# Patient Record
Sex: Female | Born: 1970 | Race: White | Hispanic: No | Marital: Married | Smoking: Never smoker
Health system: Southern US, Community
[De-identification: ages and names within clinical notes are randomized; demographics above are authoritative.]

## PROBLEM LIST (undated history)

## (undated) DIAGNOSIS — Z780 Asymptomatic menopausal state: Secondary | ICD-10-CM

## (undated) DIAGNOSIS — D219 Benign neoplasm of connective and other soft tissue, unspecified: Secondary | ICD-10-CM

## (undated) DIAGNOSIS — N840 Polyp of corpus uteri: Secondary | ICD-10-CM

## (undated) DIAGNOSIS — E785 Hyperlipidemia, unspecified: Secondary | ICD-10-CM

## (undated) DIAGNOSIS — N939 Abnormal uterine and vaginal bleeding, unspecified: Secondary | ICD-10-CM

## (undated) DIAGNOSIS — N83209 Unspecified ovarian cyst, unspecified side: Secondary | ICD-10-CM

## (undated) HISTORY — DX: Hyperlipidemia, unspecified: E78.5

## (undated) HISTORY — DX: Abnormal uterine and vaginal bleeding, unspecified: N93.9

## (undated) HISTORY — PX: DILATION AND CURETTAGE OF UTERUS: SHX78

## (undated) HISTORY — DX: Benign neoplasm of connective and other soft tissue, unspecified: D21.9

## (undated) HISTORY — DX: Asymptomatic menopausal state: Z78.0

## (undated) HISTORY — DX: Polyp of corpus uteri: N84.0

## (undated) HISTORY — DX: Unspecified ovarian cyst, unspecified side: N83.209

## (undated) HISTORY — PX: OTHER SURGICAL HISTORY: SHX169

---

## 2004-02-14 ENCOUNTER — Other Ambulatory Visit: Admission: RE | Admit: 2004-02-14 | Discharge: 2004-02-14 | Payer: Self-pay | Admitting: Obstetrics and Gynecology

## 2004-03-08 ENCOUNTER — Encounter: Admission: RE | Admit: 2004-03-08 | Discharge: 2004-03-08 | Payer: Self-pay | Admitting: Obstetrics and Gynecology

## 2005-03-14 ENCOUNTER — Other Ambulatory Visit: Admission: RE | Admit: 2005-03-14 | Discharge: 2005-03-14 | Payer: Self-pay | Admitting: Obstetrics and Gynecology

## 2006-03-17 ENCOUNTER — Other Ambulatory Visit: Admission: RE | Admit: 2006-03-17 | Discharge: 2006-03-17 | Payer: Self-pay | Admitting: Obstetrics and Gynecology

## 2007-04-03 ENCOUNTER — Other Ambulatory Visit: Admission: RE | Admit: 2007-04-03 | Discharge: 2007-04-03 | Payer: Self-pay | Admitting: Obstetrics & Gynecology

## 2008-07-01 ENCOUNTER — Other Ambulatory Visit: Admission: RE | Admit: 2008-07-01 | Discharge: 2008-07-01 | Payer: Self-pay | Admitting: Obstetrics and Gynecology

## 2008-10-04 ENCOUNTER — Encounter: Admission: RE | Admit: 2008-10-04 | Discharge: 2008-10-04 | Payer: Self-pay | Admitting: Unknown Physician Specialty

## 2009-03-06 ENCOUNTER — Encounter: Admission: RE | Admit: 2009-03-06 | Discharge: 2009-03-06 | Payer: Self-pay | Admitting: Obstetrics and Gynecology

## 2009-04-04 ENCOUNTER — Encounter: Admission: RE | Admit: 2009-04-04 | Discharge: 2009-04-04 | Payer: Self-pay | Admitting: Unknown Physician Specialty

## 2010-03-27 ENCOUNTER — Encounter: Admission: RE | Admit: 2010-03-27 | Discharge: 2010-03-27 | Payer: Self-pay | Admitting: Obstetrics and Gynecology

## 2010-10-30 ENCOUNTER — Ambulatory Visit: Payer: Self-pay | Admitting: Obstetrics & Gynecology

## 2010-10-31 ENCOUNTER — Encounter: Payer: Self-pay | Admitting: Obstetrics & Gynecology

## 2010-11-06 ENCOUNTER — Encounter
Admission: RE | Admit: 2010-11-06 | Discharge: 2010-11-06 | Payer: Self-pay | Source: Home / Self Care | Attending: Obstetrics & Gynecology | Admitting: Obstetrics & Gynecology

## 2010-11-13 ENCOUNTER — Ambulatory Visit: Payer: Self-pay | Admitting: Obstetrics & Gynecology

## 2010-11-27 ENCOUNTER — Ambulatory Visit
Admission: RE | Admit: 2010-11-27 | Discharge: 2010-11-27 | Payer: Self-pay | Source: Home / Self Care | Attending: Obstetrics & Gynecology | Admitting: Obstetrics & Gynecology

## 2010-12-15 ENCOUNTER — Other Ambulatory Visit: Payer: Self-pay | Admitting: Obstetrics & Gynecology

## 2010-12-15 DIAGNOSIS — Z09 Encounter for follow-up examination after completed treatment for conditions other than malignant neoplasm: Secondary | ICD-10-CM

## 2010-12-15 DIAGNOSIS — N83209 Unspecified ovarian cyst, unspecified side: Secondary | ICD-10-CM

## 2010-12-31 ENCOUNTER — Ambulatory Visit
Admission: RE | Admit: 2010-12-31 | Discharge: 2010-12-31 | Disposition: A | Discharge status: Home / Self Care | Payer: BC Managed Care – PPO | Source: Ambulatory Visit | Attending: Obstetrics & Gynecology | Admitting: Obstetrics & Gynecology

## 2010-12-31 DIAGNOSIS — N83209 Unspecified ovarian cyst, unspecified side: Secondary | ICD-10-CM

## 2010-12-31 DIAGNOSIS — Z09 Encounter for follow-up examination after completed treatment for conditions other than malignant neoplasm: Secondary | ICD-10-CM

## 2011-01-23 ENCOUNTER — Other Ambulatory Visit: Payer: Self-pay | Admitting: Obstetrics & Gynecology

## 2011-01-23 ENCOUNTER — Ambulatory Visit (HOSPITAL_COMMUNITY)
Admission: RE | Admit: 2011-01-23 | Discharge: 2011-01-23 | Disposition: A | Discharge status: Home / Self Care | Payer: BC Managed Care – PPO | Source: Ambulatory Visit | Attending: Obstetrics & Gynecology | Admitting: Obstetrics & Gynecology

## 2011-01-23 DIAGNOSIS — N840 Polyp of corpus uteri: Secondary | ICD-10-CM

## 2011-01-23 DIAGNOSIS — N920 Excessive and frequent menstruation with regular cycle: Secondary | ICD-10-CM | POA: Insufficient documentation

## 2011-01-23 DIAGNOSIS — D500 Iron deficiency anemia secondary to blood loss (chronic): Secondary | ICD-10-CM | POA: Insufficient documentation

## 2011-01-23 LAB — PREGNANCY, URINE: Preg Test, Ur: NEGATIVE

## 2011-01-23 LAB — CBC
MCHC: 29.5 g/dL — ABNORMAL LOW (ref 30.0–36.0)
RBC: 3.88 MIL/uL (ref 3.87–5.11)
RDW: 15.3 % (ref 11.5–15.5)

## 2011-02-19 ENCOUNTER — Encounter: Payer: BC Managed Care – PPO | Admitting: Obstetrics & Gynecology

## 2011-02-19 DIAGNOSIS — N898 Other specified noninflammatory disorders of vagina: Secondary | ICD-10-CM

## 2011-02-26 NOTE — Assessment & Plan Note (Signed)
NAMEAMIA, RYNDERS NO.:  [id number]  MEDICAL RECORD NO.:  [id number]           PATIENT TYPE:  LOCATION:  [facility] at [place]           FACILITY:  PHYSICIAN:  Elsie Lincoln, MD      DATE OF BIRTH:  30-Sep-1971  DATE OF SERVICE:  02/19/2011                                 CLINIC NOTE  The patient is a 40 year old female who presents after endometrial ablation approximately 1 month ago.  The patient had a little irritation from wearing a pad all the time but this has decreased after she stopped wearing the pad.  She is having some vaginal discharge.  She did have a "period" which was 5-7 days of scant, pinkish discharge.  She is very happy after the procedure.  Postprocedure, she had some nausea but that resolved.  We are attributing this to the anesthesia.  The patient is up- to-date on her Pap smear.  It was done in December 2011.  The patient states she is up-to-date on her mammogram, however, I did not order this.  I believe she goes to her primary care physician.  PHYSICAL EXAMINATION:  VITAL SIGNS:  Pulse 75, blood pressure 99/65, weight 124, height 68 inches. GENERAL:  Well nourished, well developed, no apparent distress. HEENT:  Normocephalic and atraumatic.  The patient is recovering and taking antibiotics for sinus infection. ABDOMEN:  Soft and nontender.  No rebound or guarding.  Inguinal region, right inguinal lymph node resolving from being enlarged.  Inguinal lymph nodes, no lymphadenopathy. GENITALIA:  Tanner V.  No evidence of irritation.  Cervix closed and nontender.  Vaginal vault is pink, normal rugae, small amount of clear discharge with slight brownish tinge.  Uterus anteverted, nontender. Adnexa; no masses, nontender.  ASSESSMENT AND PLAN:  A 40 year old female status post endometrial ablation, doing well. 1. Wet prep. 2. Return to clinic in a year for annual exam. 3. Follow up primary care doctor for healthcare maintenance. 4. The  patient states she is up-to-date on mammogram.          ______________________________ Elsie Lincoln, MD    KL/MEDQ  D:  02/19/2011  T:  02/20/2011  Job:  [id number]

## 2011-03-08 NOTE — Op Note (Signed)
  Sierra Coleman, Sierra Coleman NO.:  [id number]  MEDICAL RECORD NO.:  [id number]           PATIENT TYPE:  O  LOCATION:  [facility]                          [facility]  PHYSICIAN:  Lesly Dukes, M.D. DATE OF BIRTH:  [birth year]  DATE OF PROCEDURE:  01/23/2011 DATE OF DISCHARGE:                              OPERATIVE REPORT   PREOPERATIVE DIAGNOSIS:  A 40 year old female with polyp on biopsy, menorrhagia causing anemia.  POSTOPERATIVE DIAGNOSIS:  A 40 year old female with polyp on biopsy, menorrhagia causing anemia.  PROCEDURES: 1. D and C. 2. Hydrothermal ablation.  SURGEON:  Lesly Dukes, MD  ANESTHESIA:  General.  FINDINGS:  Normal-size cavity with no endometrial polyps seen on hysteroscopy.  SPECIMENS:  Endometrial curettings to Pathology.  ESTIMATED BLOOD LOSS:  Minimal.  COMPLICATIONS:  None.  DESCRIPTION OF PROCEDURE:  After informed consent was obtained, the patient was taken to the operating room where general anesthesia was induced.  The patient was placed in the dorsal lithotomy position. Prepped and draped in normal sterile fashion.  The bladder was emptied with a Foley catheter and SCDs were placed on lower extremities.  A bivalve speculum was placed in to the patient's vagina and the cervix was brought into view.  The anterior lip of the cervix was grasped with single-tooth tenaculum and the cervical os was gently dilated with half- size Hegar dilators to #8.  The hysteroscope was gently introduced into the uterus.  Hysteroscopy was performed.  Each ostia was seen without difficulty and no polyp was seen.  A hydrothermal ablation was completed via [employer].  There was integrity of the cavity was ensured.  There was an appropriate warm-up phase, a 10-minute ablation, and an appropriate cool down phase.  The hysteroscope was removed and a curettage was performed and curettings sent to Pathology.  There was no  bleeding at the end of the procedure.  All instruments were removed from the patient's vagina and the patient tolerated the procedure well.  Sponge, lap, instrument, and needle count correct x2 and the patient recovered in stable condition.     Lesly Dukes, M.D.     Lora Paula  D:  01/23/2011  T:  01/24/2011  Job:  [id number]  Electronically Signed by Elsie Lincoln M.D. on 03/08/2011 11:24:15 AM

## 2011-03-24 ENCOUNTER — Emergency Department (HOSPITAL_BASED_OUTPATIENT_CLINIC_OR_DEPARTMENT_OTHER)
Admission: EM | Admit: 2011-03-24 | Discharge: 2011-03-24 | Disposition: A | Discharge status: Home / Self Care | Payer: BC Managed Care – PPO | Attending: Emergency Medicine | Admitting: Emergency Medicine

## 2011-03-24 ENCOUNTER — Emergency Department (INDEPENDENT_AMBULATORY_CARE_PROVIDER_SITE_OTHER): Payer: BC Managed Care – PPO

## 2011-03-24 DIAGNOSIS — R55 Syncope and collapse: Secondary | ICD-10-CM

## 2011-03-24 DIAGNOSIS — D649 Anemia, unspecified: Secondary | ICD-10-CM | POA: Insufficient documentation

## 2011-03-24 LAB — COMPREHENSIVE METABOLIC PANEL
ALT: 16 U/L (ref 0–35)
Albumin: 3.9 g/dL (ref 3.5–5.2)
Alkaline Phosphatase: 74 U/L (ref 39–117)
CO2: 26 mEq/L (ref 19–32)
Calcium: 8.7 mg/dL (ref 8.4–10.5)
Creatinine, Ser: 0.6 mg/dL (ref 0.4–1.2)
Glucose, Bld: 102 mg/dL — ABNORMAL HIGH (ref 70–99)
Potassium: 3.5 mEq/L (ref 3.5–5.1)
Sodium: 140 mEq/L (ref 135–145)
Total Bilirubin: 0.4 mg/dL (ref 0.3–1.2)

## 2011-03-24 LAB — DIFFERENTIAL
Eosinophils Absolute: 0.3 10*3/uL (ref 0.0–0.7)
Lymphocytes Relative: 23 % (ref 12–46)
Monocytes Absolute: 0.6 10*3/uL (ref 0.1–1.0)
Neutrophils Relative %: 59 % (ref 43–77)

## 2011-03-24 LAB — CBC
MCH: 21.3 pg — ABNORMAL LOW (ref 26.0–34.0)
Platelets: 272 10*3/uL (ref 150–400)
RBC: 4.14 MIL/uL (ref 3.87–5.11)
RDW: 17.8 % — ABNORMAL HIGH (ref 11.5–15.5)

## 2011-03-24 LAB — URINE MICROSCOPIC-ADD ON

## 2011-03-24 LAB — POCT CARDIAC MARKERS: Myoglobin, poc: 21.3 ng/mL (ref 12–200)

## 2011-03-24 LAB — URINALYSIS, ROUTINE W REFLEX MICROSCOPIC
Bilirubin Urine: NEGATIVE
Leukocytes, UA: NEGATIVE
Nitrite: NEGATIVE
Protein, ur: NEGATIVE mg/dL
Specific Gravity, Urine: 1.017 (ref 1.005–1.030)
pH: 5 (ref 5.0–8.0)

## 2011-04-09 NOTE — Assessment & Plan Note (Signed)
Sierra Coleman, Sierra Coleman               ACCOUNT NO.:  [id number]   MEDICAL RECORD NO.:  [id number]          PATIENT TYPE:  POB   LOCATION:  [facility] [place]         FACILITY:  [facility]   PHYSICIAN:  Elsie Lincoln, MD      DATE OF BIRTH:  1970-12-24   DATE OF SERVICE:  11/13/2010                                  CLINIC NOTE   The patient is a 40 year old female who presents for endometrial biopsy.  The patient has had irregular menses.  Most of the time, she has several  months without a period and then they get heavy and spotting between  periods.  This past time, she had a 2-week period.  She did have a  transvaginal ultrasound which showed a 9-mm stripe, the right ovary is  normal, left ovary show 3.7 cm simple cyst.   PHYSICAL EXAMINATION:  VITAL SIGNS:  Today, pulse 72, blood pressure  110/59, weight 124, height 68 inches.  GENERAL:  Well nourished, well developed, in no apparent stress.  PELVIC:  On speculum exam, the patient was noted to have an ulceration  between 4 and 5 o'clock on the cervix.  The patient does not have a  history of herpes.  Given this is a new finding and this is where the  patient is bleeding from, a biopsy is warranted.  She denies any trauma  to the area.  She denies any painful intercourse.  This was added to the  consent.  The area was cleaned with Betadine and a biopsy was taken with  a Kevorkian punch biopsy and then the endometrial biopsy is performed.  The anterior lip of the cervix is grasped with single-tooth tenaculum  and a Pipelle was inserted into the uterus.  Uterus sounded to  approximately 8 cm, 2 passes were made with a large amount of tissue  obtained.  The patient tolerated the procedure well.   ASSESSMENT/PLAN:  A 40 year old female with irregular menses.  1. Endometrial biopsy.  2. New found cervical lesion and cervical biopsy obtained.  3. Need to follow up on mammogram of the right breast.  4. Day 3 FSH needs to be ordered.        ______________________________  Elsie Lincoln, MD     KL/MEDQ  D:  11/13/2010  T:  11/14/2010  Job:  [id number]

## 2011-04-09 NOTE — Assessment & Plan Note (Signed)
Sierra Coleman, Sierra Coleman               ACCOUNT NO.:  [id number]   MEDICAL RECORD NO.:  [id number]          PATIENT TYPE:  POB   LOCATION:  [facility] [place]         FACILITY:  [facility]   PHYSICIAN:  Elsie Lincoln, MD      DATE OF BIRTH:  1971-10-16   DATE OF SERVICE:  10/30/2010                                  CLINIC NOTE   The patient is a 40 year old, para 2 female who presents for her yearly  exam.  The patient had normal periods up to May 2010, after that point  the patient's menses became very irregular.  She can get several months  without a period and they are very heavy and she is also having spotting  between periods.  She does have an FSH drawn by her prior gynecologist  which the patient states was normal.  I do not have these records.  She  has never had a endometrial biopsy or transvaginal ultrasound.  She did  have a D and C 10 years ago due to the fibroid.  She also has some  ovarian cyst drained vaginally has a child who is having records of this  either.   PAST MEDICAL HISTORY:  High cholesterol.   PAST SURGICAL HISTORY:  D and C.  No history of a blood transfusion.   GYNECOLOGIC HISTORY:  Irregular menses as described above.  No history  of abnormal Pap smear, positive history of fibroids, positive history of  ovarian cysts that were drained vaginally.  The patient's partner has  had a vasectomy, 2 vaginal deliveries.  Last Pap smear was August 2010.   MEDICATIONS:  None.   ALLERGIES:  None.   SOCIAL HISTORY:  The patient works as a Manufacturing systems engineer.  She lives  with her spouse and 2 children.  Her eldest child is getting ready to go  to college. She does not smoke, drink alcohol.  She has never used  drugs.  She has never been sexually or physically abused.  She has had  one partner in the past year.   FAMILY HISTORY:  Has heart disease in a paternal grandfather.  She only  complains of joint pain in the left knee over the past year.   PHYSICAL EXAMINATION:   VITAL SIGNS:  Pulse 73, blood pressure 111/54,  weight 127, height 68 inches.  GENERAL:  Well-nourished, well-developed, in no apparent distress.  HEENT:  Normocephalic, atraumatic.  Thyroid no masses.  LUNGS:  Clear to auscultation bilaterally.  HEART:  Regular rate and rhythm.  BREASTS:  Right breast smaller than left breast.  There is a small hard  nodule to the right of the left nipple.  The patient also feels this  mass now at that point was brought to her attention.  Her breasts are  difficult to examine due to probable fibrocystic breasts, but this area  seems hard than the rest of her breasts.  No lymphadenopathy or nipple  discharge.  ABDOMEN:  Soft, nontender.  No organomegaly, no hernia.  GENITALIA:  Tanner V.  Vagina pink, normal rugae.  The patient is to  start her menses, but she was unaware.  Cervix closed, nontender.  Positive ovarian cyst.  Uterus is retroverted and slightly enlarged and  tilted to the left adnexa.  Ovaries are not felt, but no masses,  nontender.  Rectovaginal tender.  No nodularity.   ASSESSMENT AND PLAN:  A 40 year old para 2 female for well-woman exam:  1. Pap smear.  2. Diagnostic mammogram ultrasound.  3. Transvaginal ultrasound.  4. Endometrial biopsy at next visit.           ______________________________  Elsie Lincoln, MD     KL/MEDQ  D:  10/30/2010  T:  10/31/2010  Job:  [id number]

## 2011-04-09 NOTE — Assessment & Plan Note (Signed)
Sierra Coleman, Sierra Coleman               ACCOUNT NO.:  [id number]   MEDICAL RECORD NO.:  [id number]          PATIENT TYPE:  POB   LOCATION:  [facility] [place]         FACILITY:  [facility]   PHYSICIAN:  Elsie Lincoln, MD      DATE OF BIRTH:  03/10/1971   DATE OF SERVICE:  11/27/2010                                  CLINIC NOTE   The patient is a 40 year old female who presents for test results.  The  patient had no period for 3 months, which has started today.  She also  has some irregular spotting.  Her pathology shows secretory endometrium  with benign endometrial polyp.  This biopsy was done November 14, 2010.  Since the patient is symptomatic with the polyp, I believe that needs to  be resected, we talked about this and the patient agrees.  We also  discussed the benefits of ablation and hopefully decrease the risk of  recurrence of polyps and help with her irregular bleeding, the patient  also agrees to this.  We will review the biopsy of the cervix, which was  cervicitis with no squamous intraepithelial lesion, no evidence of  herpes either.  The patient was also to get an [facility] drawn, but she did  not do this.  We discussed in detail the risks, benefits, alternatives  to the D and C, hysteroscopy, polypectomy, and hydrothermal ablation.  We also talked about NovaSure and the ThermaChoice balloon.  The risk  include but not limited to bleeding, infection, damage to the back of  the uterus, and burning of vagina.  The patient has a history of ovarian  cyst on the left.  She will have a follow up ultrasound done at the  beginning of February before this surgery is scheduled to see if this  needs to be addressed.  Most likely, this will be gone and we will just  proceed with the procedure as described above.  Medical and surgical  history was review as well as medication.  The patient will be scheduled  in February after the ultrasound.           ______________________________  Elsie Lincoln, MD     KL/MEDQ  D:  11/27/2010  T:  11/28/2010  Job:  [id number]

## 2011-05-01 ENCOUNTER — Other Ambulatory Visit: Payer: Self-pay | Admitting: Unknown Physician Specialty

## 2011-05-01 DIAGNOSIS — Z1231 Encounter for screening mammogram for malignant neoplasm of breast: Secondary | ICD-10-CM

## 2011-05-14 ENCOUNTER — Ambulatory Visit
Admission: RE | Admit: 2011-05-14 | Discharge: 2011-05-14 | Disposition: A | Discharge status: Home / Self Care | Payer: BC Managed Care – PPO | Source: Ambulatory Visit | Attending: Unknown Physician Specialty | Admitting: Unknown Physician Specialty

## 2011-05-14 DIAGNOSIS — Z1231 Encounter for screening mammogram for malignant neoplasm of breast: Secondary | ICD-10-CM

## 2012-03-17 ENCOUNTER — Encounter: Payer: Self-pay | Admitting: *Deleted

## 2012-03-17 ENCOUNTER — Ambulatory Visit (INDEPENDENT_AMBULATORY_CARE_PROVIDER_SITE_OTHER): Payer: BC Managed Care – PPO | Admitting: Obstetrics & Gynecology

## 2012-03-17 ENCOUNTER — Encounter: Payer: Self-pay | Admitting: Obstetrics & Gynecology

## 2012-03-17 VITALS — BP 115/71 | HR 81 | Temp 97.6°F | Ht 68.0 in | Wt 127.0 lb

## 2012-03-17 DIAGNOSIS — Z01419 Encounter for gynecological examination (general) (routine) without abnormal findings: Secondary | ICD-10-CM

## 2012-03-17 DIAGNOSIS — D649 Anemia, unspecified: Secondary | ICD-10-CM

## 2012-03-17 DIAGNOSIS — Z124 Encounter for screening for malignant neoplasm of cervix: Secondary | ICD-10-CM

## 2012-03-17 DIAGNOSIS — Z1151 Encounter for screening for human papillomavirus (HPV): Secondary | ICD-10-CM

## 2012-03-17 NOTE — Progress Notes (Signed)
  Subjective:     Sierra Coleman is a 41 y.o. woman who comes in today for a  pap smear only. Her most recent annual exam was on 2011. Her most recent Pap smear was on 2011 and showed no abnormalities. Previous abnormal Pap smears: no. Pt not menstruating due to prior ablation.  The following portions of the patient's history were reviewed and updated as appropriate: allergies, current medications, past family history, past medical history, past social history, past surgical history and problem list.  Review of Systems A comprehensive review of systems was negative except for: bilaterally sore for 2 weeks.  Most likely due to ovulation   Objective:    BP 115/71  Pulse 81  Temp(Src) 97.6 F (36.4 C) (Oral)  Ht 5\' 8"  (1.727 m)  Wt 127 lb 0.6 oz (57.625 kg)  BMI 19.32 kg/m2 Vitals:  WNL General appearance: alert, cooperative and no distress Head: Normocephalic, without obvious abnormality, atraumatic Eyes: negative Throat: lips, mucosa, and tongue normal; teeth and gums normal Lungs: clear to auscultation bilaterally Breasts: normal appearance, no masses or tenderness, No nipple retraction or dimpling, No nipple discharge or bleeding Heart: regular rate and rhythm Abdomen: soft, non-tender; bowel sounds normal; no masses,  no organomegaly Pelvic: cervix normal in appearance, external genitalia normal, no adnexal masses or tenderness, no bladder tenderness, no cervical motion tenderness, perianal skin: no external genital warts noted, rectovaginal septum normal, urethra without abnormality or discharge, uterus normal size, shape, and consistency and vagina normal without discharge Extremities: no edema, redness or tenderness in the calves or thighs Skin: scalely lesion on right breast--sees dermatologist. Lymph nodes: Axillary adenopathy: none     Assessment:    Screening pap smear.   Plan:    Follow up in 1 year, or as indicated by Pap results.  Calcium supplementation  reviewed. Last Mammogram 6/12--wnl

## 2012-04-30 ENCOUNTER — Other Ambulatory Visit: Payer: Self-pay | Admitting: Unknown Physician Specialty

## 2012-04-30 DIAGNOSIS — Z1231 Encounter for screening mammogram for malignant neoplasm of breast: Secondary | ICD-10-CM

## 2012-05-19 ENCOUNTER — Ambulatory Visit
Admission: RE | Admit: 2012-05-19 | Discharge: 2012-05-19 | Disposition: A | Discharge status: Home / Self Care | Payer: BC Managed Care – PPO | Source: Ambulatory Visit | Attending: Unknown Physician Specialty | Admitting: Unknown Physician Specialty

## 2012-05-19 DIAGNOSIS — Z1231 Encounter for screening mammogram for malignant neoplasm of breast: Secondary | ICD-10-CM

## 2012-08-20 IMAGING — US US TRANSVAGINAL NON-OB
1 series · 14 of 25 positions shown · non-contrast
Comparison: 11/06/2010

CLINICAL DATA: Follow-up left ovarian cystic lesion.  LMP
12/17/2010.

TRANSVAGINAL ULTRASOUND OF PELVIS
TECHNIQUE: Transvaginal ultrasound examination of the pelvis was
performed including evaluation of the uterus, ovaries, adnexal
regions, and pelvic cul-de-sac.

[Series 1: us transvaginal non-ob · 0.13mm/px · 14 of 34 slices shown]
[im 1/34]
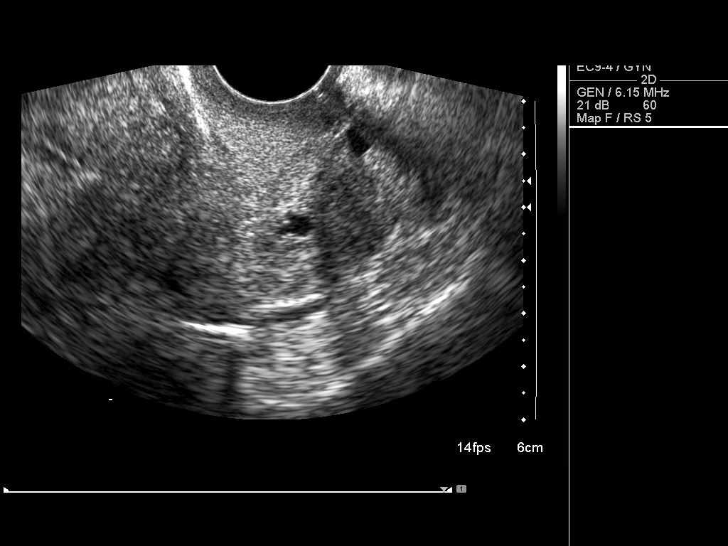
[im 3/34]
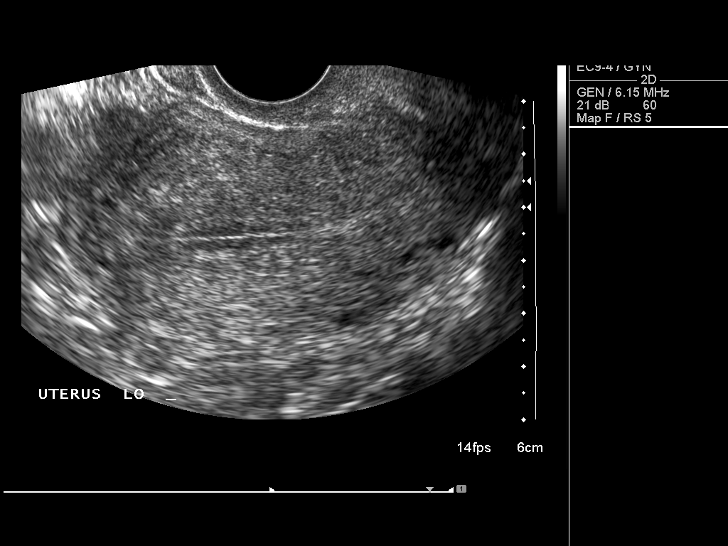
[im 6/34]
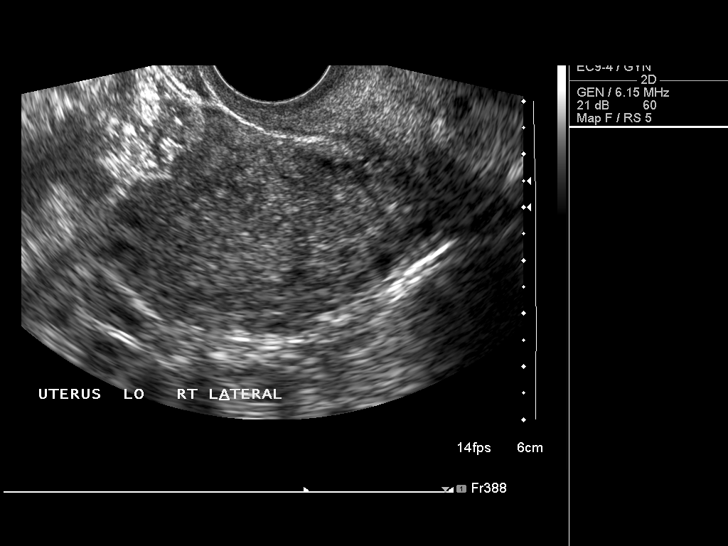
[im 9/34]
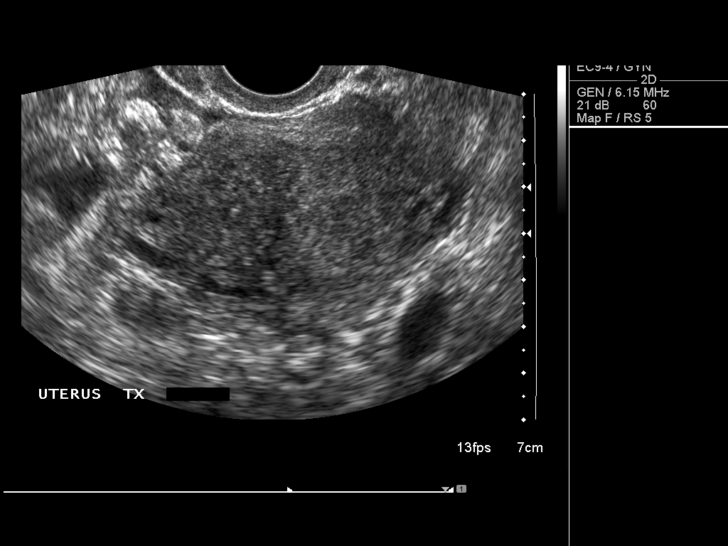
[im 12/34]
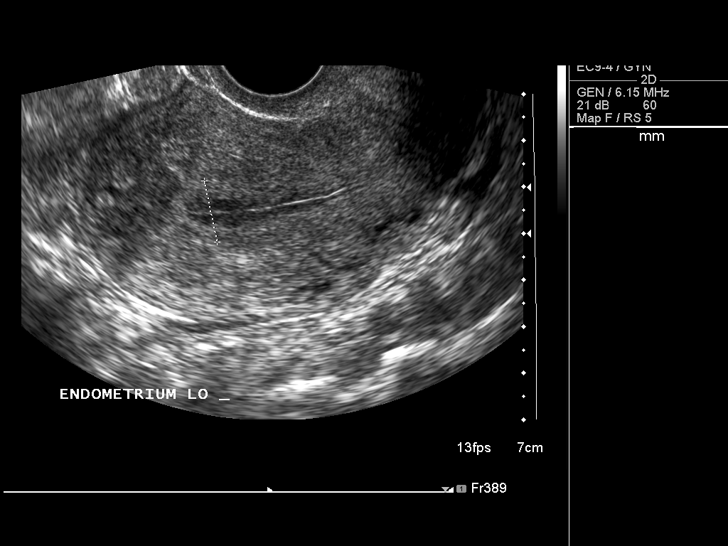
[im 13/34]
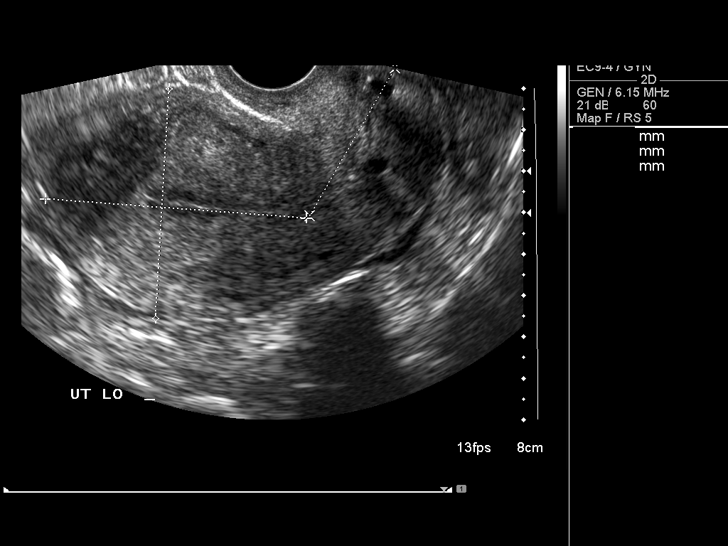
[im 16/34]
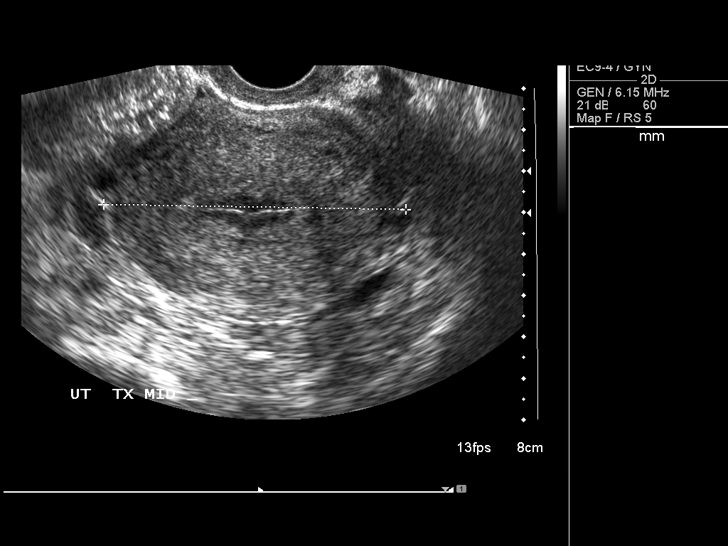
[im 18/34]
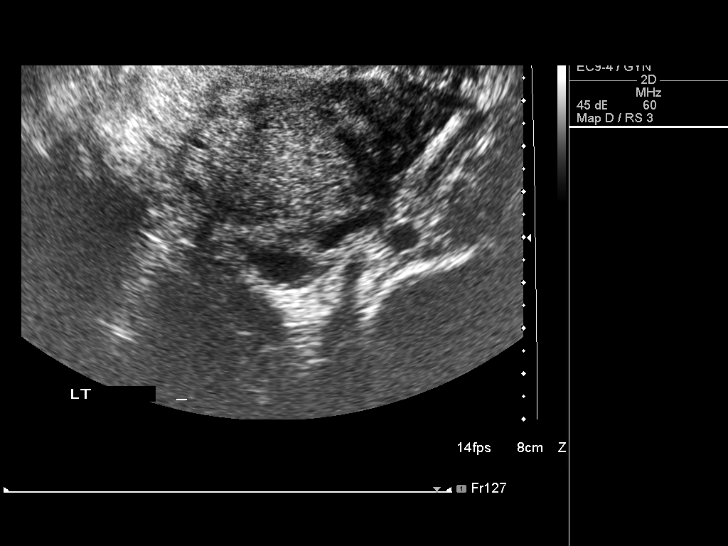
[im 21/34]
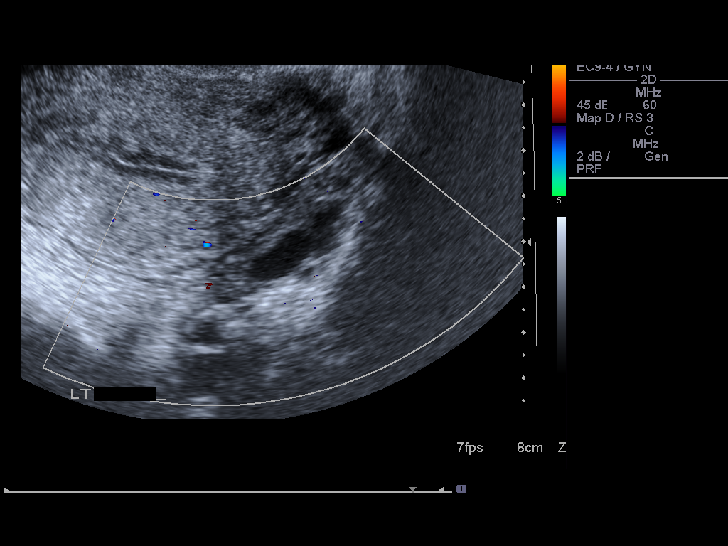
[im 23/34]
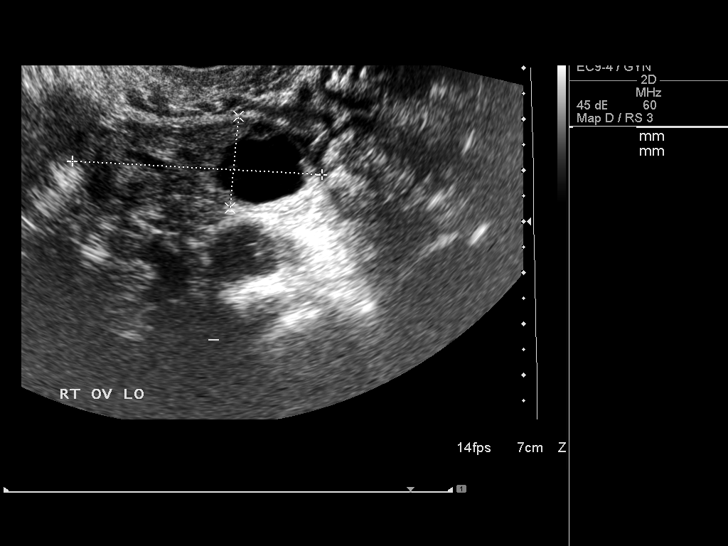
[im 25/34]
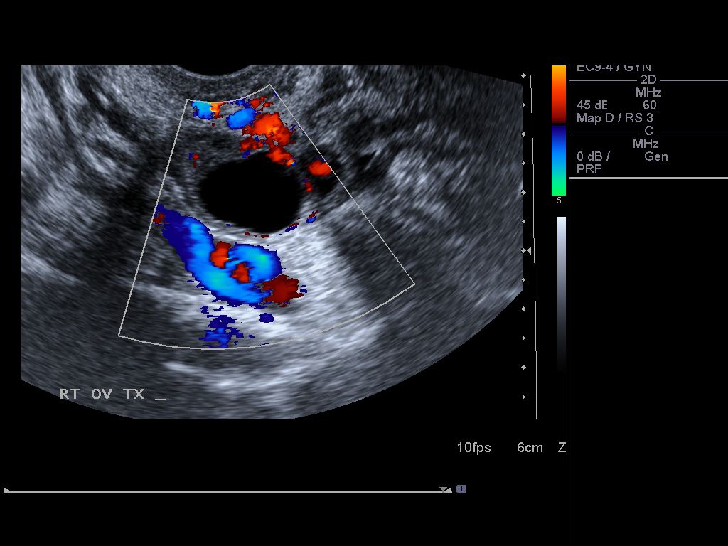
[im 28/34]
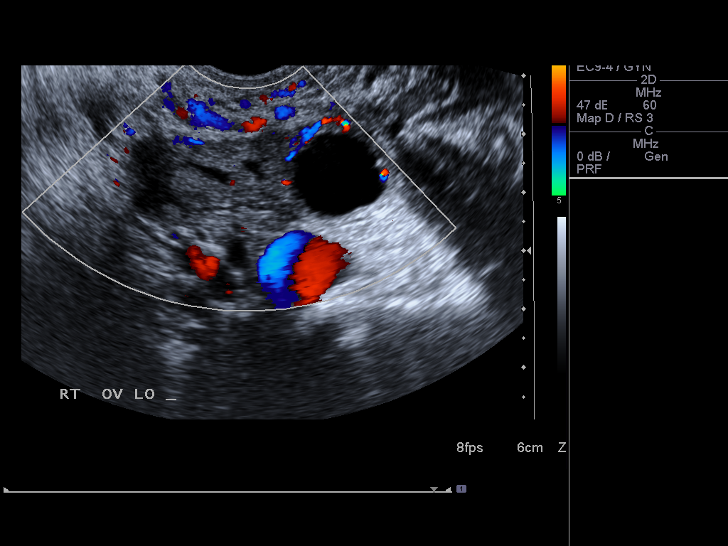
[im 31/34]
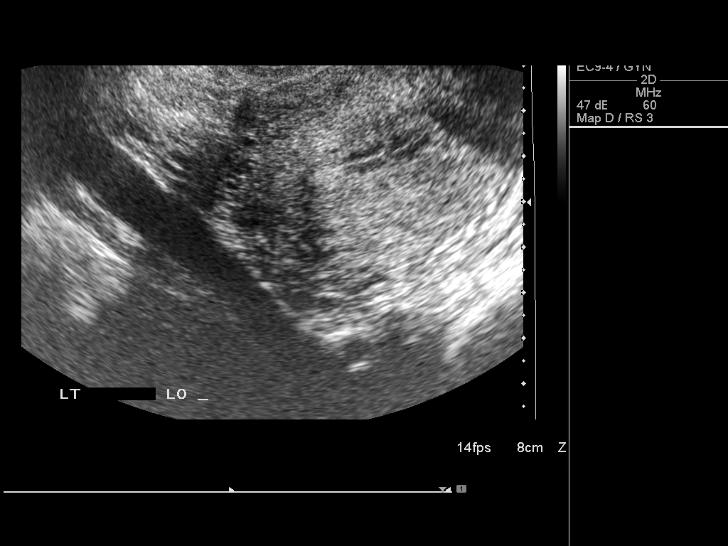
[im 34/34]
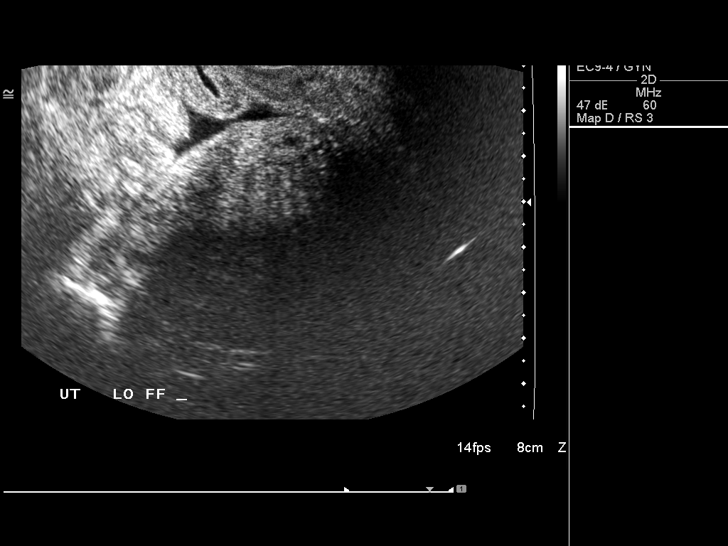

[14 of 25 positions shown; findings below may reference images not displayed]

FINDINGS: Uterus measures 10.4 x 5.6 x 7.3 cm.  No fibroids or other uterine
masses identified.

Endometrium measures 13 mm in thickness.  Within normal limits in
appearance.

Right Ovary measures 4.9 x 1.8 x 2.5 cm.  Normal appearance.

Left Ovary measures 3.8 x 1.6 x 2.4 cm.  Normal
appearance.Previously seen cystic lesion the left ovary is no
longer visualized.

Other Findings:  A small amount of free fluid in the right adnexa.
IMPRESSION: Resolution of functional left ovarian cyst since prior exam.  No
evidence of pelvic mass or other significant abnormality.

## 2013-06-14 ENCOUNTER — Other Ambulatory Visit: Payer: Self-pay | Admitting: Unknown Physician Specialty

## 2013-06-14 DIAGNOSIS — Z139 Encounter for screening, unspecified: Secondary | ICD-10-CM

## 2013-06-15 ENCOUNTER — Ambulatory Visit (INDEPENDENT_AMBULATORY_CARE_PROVIDER_SITE_OTHER): Payer: BC Managed Care – PPO

## 2013-06-15 ENCOUNTER — Ambulatory Visit: Payer: BC Managed Care – PPO

## 2013-06-15 DIAGNOSIS — Z1231 Encounter for screening mammogram for malignant neoplasm of breast: Secondary | ICD-10-CM

## 2013-06-24 ENCOUNTER — Ambulatory Visit: Payer: BC Managed Care – PPO

## 2013-07-15 ENCOUNTER — Ambulatory Visit (INDEPENDENT_AMBULATORY_CARE_PROVIDER_SITE_OTHER): Payer: BC Managed Care – PPO | Admitting: Obstetrics & Gynecology

## 2013-07-15 ENCOUNTER — Encounter: Payer: Self-pay | Admitting: Obstetrics & Gynecology

## 2013-07-15 VITALS — BP 98/64 | HR 59 | Resp 14 | Ht 67.0 in | Wt 126.0 lb

## 2013-07-15 DIAGNOSIS — Z01419 Encounter for gynecological examination (general) (routine) without abnormal findings: Secondary | ICD-10-CM

## 2013-07-15 NOTE — Progress Notes (Signed)
  Subjective:     Sierra Coleman is a 42 y.o. female here for a routine exam.  Current complaints: none; pt has amenorrhea after ablation.  Personal health questionnaire reviewed: yes.   Gynecologic History No LMP recorded. Patient has had an ablation. Contraception: vasectomy Last Pap: 2013. Results were: normal; HPV negative Last mammogram: 2014. Results were: normal  Obstetric History OB History  Gravida Para Term Preterm AB SAB TAB Ectopic Multiple Living  2 2 2       2     # Outcome Date GA Lbr Len/2nd Weight Sex Delivery Anes PTL Lv  2 TRM           1 TRM                The following portions of the patient's history were reviewed and updated as appropriate: allergies, current medications, past family history, past medical history, past social history, past surgical history and problem list.  Review of Systems A comprehensive review of systems was negative.    Objective:   Filed Vitals:   07/15/13 1454  BP: 98/64  Pulse: 59  Resp: 14  Height: 5\' 7"  (1.702 m)  Weight: 126 lb (57.153 kg)      Vitals:  WNL General appearance: alert, cooperative and no distress Head: Normocephalic, without obvious abnormality, atraumatic Eyes: negative Throat: lips, mucosa, and tongue normal; teeth and gums normal Lungs: clear to auscultation bilaterally Breasts: normal appearance, no masses or tenderness, No nipple retraction or dimpling, No nipple discharge or bleeding Heart: regular rate and rhythm Abdomen: soft, non-tender; bowel sounds normal; no masses,  no organomegaly Pelvic: cervix normal in appearance, external genitalia normal, no adnexal masses or tenderness, no bladder tenderness, no cervical motion tenderness, perianal skin: no external genital warts noted, rectovaginal septum normal, urethra without abnormality or discharge, uterus normal size, shape, and consistency and vagina normal without discharge Extremities: no edema, redness or tenderness in the calves or  thighs Skin: no lesions or rash Lymph nodes: Axillary adenopathy: none        Assessment:    Healthy female exam.   Pap smear due in 2016 Plan:    Education reviewed: self breast exams and skin cancer screening. Contraception: vasectomy. Follow up in: 1 year. Mammogram up todate Pt has primary care MD monitoring other health maintenance.

## 2014-06-16 ENCOUNTER — Other Ambulatory Visit: Payer: Self-pay | Admitting: Unknown Physician Specialty

## 2014-06-16 DIAGNOSIS — Z0000 Encounter for general adult medical examination without abnormal findings: Secondary | ICD-10-CM

## 2014-06-21 ENCOUNTER — Ambulatory Visit: Payer: BC Managed Care – PPO

## 2014-06-23 ENCOUNTER — Ambulatory Visit (INDEPENDENT_AMBULATORY_CARE_PROVIDER_SITE_OTHER): Payer: BC Managed Care – PPO

## 2014-06-23 DIAGNOSIS — Z1231 Encounter for screening mammogram for malignant neoplasm of breast: Secondary | ICD-10-CM

## 2014-06-23 DIAGNOSIS — Z0000 Encounter for general adult medical examination without abnormal findings: Secondary | ICD-10-CM

## 2014-09-26 ENCOUNTER — Encounter: Payer: Self-pay | Admitting: Obstetrics & Gynecology

## 2015-01-20 ENCOUNTER — Other Ambulatory Visit: Payer: Self-pay | Admitting: Family Medicine

## 2015-01-20 ENCOUNTER — Ambulatory Visit (INDEPENDENT_AMBULATORY_CARE_PROVIDER_SITE_OTHER): Payer: BLUE CROSS/BLUE SHIELD

## 2015-01-20 DIAGNOSIS — M25521 Pain in right elbow: Secondary | ICD-10-CM

## 2015-01-20 DIAGNOSIS — M778 Other enthesopathies, not elsewhere classified: Secondary | ICD-10-CM

## 2015-05-23 ENCOUNTER — Ambulatory Visit (INDEPENDENT_AMBULATORY_CARE_PROVIDER_SITE_OTHER): Payer: BLUE CROSS/BLUE SHIELD | Admitting: Obstetrics & Gynecology

## 2015-05-23 ENCOUNTER — Encounter: Payer: Self-pay | Admitting: Obstetrics & Gynecology

## 2015-05-23 VITALS — BP 104/69 | HR 69 | Resp 16 | Ht 66.0 in | Wt 131.0 lb

## 2015-05-23 DIAGNOSIS — Z1151 Encounter for screening for human papillomavirus (HPV): Secondary | ICD-10-CM | POA: Diagnosis not present

## 2015-05-23 DIAGNOSIS — Z1231 Encounter for screening mammogram for malignant neoplasm of breast: Secondary | ICD-10-CM | POA: Diagnosis not present

## 2015-05-23 DIAGNOSIS — Z01419 Encounter for gynecological examination (general) (routine) without abnormal findings: Secondary | ICD-10-CM

## 2015-05-23 DIAGNOSIS — Z124 Encounter for screening for malignant neoplasm of cervix: Secondary | ICD-10-CM | POA: Diagnosis not present

## 2015-05-23 DIAGNOSIS — N951 Menopausal and female climacteric states: Secondary | ICD-10-CM | POA: Diagnosis not present

## 2015-05-23 NOTE — Patient Instructions (Addendum)
RE: MyChart  Dear Ms. Sierra Coleman  We are excited to introduce MyChart, a new best-in-class service that provides you online access to important information in your electronic medical record. We want to make it easier for you to view your health information - all in one secure location - when and where you need it. We expect MyChart will enhance the quality of care and service we provide. Use the activation code below to enroll in MyChart online at [email or web address]  When you register for MyChart, you can:  Marland Kitchen View your test results. . Communicate securely with your physician's office.  . View your medical history, allergies, medications, and immunizations. . Conveniently print information such as your medication lists.  If you are age 30 or older and want a member of your family to have access to your record, you must provide written consent by completing a proxy form available at our facility. Please speak to our clinical staff about guidelines regarding accounts for patients younger than age 24.  As you activate your MyChart account and need any technical assistance, please call the MyChart technical support line at [phone]) or email your question to [email or web address]. If you email your question(s), please include your name, a return phone number and the best time to reach you.  Thank you for using MyChart as your new health and wellness resource!  MyChart Activation Code:  [email or web address] Expires: 07/22/2015  1:25 PM     Preventive Care for Adults A healthy lifestyle and preventive care can promote health and wellness. Preventive health guidelines for women include the following key practices.  A routine yearly physical is a good way to check with your health care provider about your health and preventive screening. It is a chance to share any concerns and updates on your health and to receive a thorough exam.  Visit your dentist for a  routine exam and preventive care every 6 months. Brush your teeth twice a day and floss once a day. Good oral hygiene prevents tooth decay and gum disease.  The frequency of eye exams is based on your age, health, family medical history, use of contact lenses, and other factors. Follow your health care provider's recommendations for frequency of eye exams.  Eat a healthy diet. Foods like vegetables, fruits, whole grains, low-fat dairy products, and lean protein foods contain the nutrients you need without too many calories. Decrease your intake of foods high in solid fats, added sugars, and salt. Eat the right amount of calories for you.Get information about a proper diet from your health care provider, if necessary.  Regular physical exercise is one of the most important things you can do for your health. Most adults should get at least 150 minutes of moderate-intensity exercise (any activity that increases your heart rate and causes you to sweat) each week. In addition, most adults need muscle-strengthening exercises on 2 or more days a week.  Maintain a healthy weight. The body mass index (BMI) is a screening tool to identify possible weight problems. It provides an estimate of body fat based on height and weight. Your health care provider can find your BMI and can help you achieve or maintain a healthy weight.For adults 20 years and older:  A BMI below 18.5 is considered underweight.  A BMI of 18.5 to 24.9 is normal.  A BMI of 25 to 29.9 is considered overweight.  A BMI of 30 and above is considered obese.  Maintain normal blood lipids and cholesterol levels  by exercising and minimizing your intake of saturated fat. Eat a balanced diet with plenty of fruit and vegetables. Blood tests for lipids and cholesterol should begin at age 53 and be repeated every 5 years. If your lipid or cholesterol levels are high, you are over 50, or you are at high risk for heart disease, you may need your  cholesterol levels checked more frequently.Ongoing high lipid and cholesterol levels should be treated with medicines if diet and exercise are not working.  If you smoke, find out from your health care provider how to quit. If you do not use tobacco, do not start.  Lung cancer screening is recommended for adults aged 83-80 years who are at high risk for developing lung cancer because of a history of smoking. A yearly low-dose CT scan of the lungs is recommended for people who have at least a 30-pack-year history of smoking and are a current smoker or have quit within the past 15 years. A pack year of smoking is smoking an average of 1 pack of cigarettes a day for 1 year (for example: 1 pack a day for 30 years or 2 packs a day for 15 years). Yearly screening should continue until the smoker has stopped smoking for at least 15 years. Yearly screening should be stopped for people who develop a health problem that would prevent them from having lung cancer treatment.  If you are pregnant, do not drink alcohol. If you are breastfeeding, be very cautious about drinking alcohol. If you are not pregnant and choose to drink alcohol, do not have more than 1 drink per day. One drink is considered to be 12 ounces (355 mL) of beer, 5 ounces (148 mL) of wine, or 1.5 ounces (44 mL) of liquor.  Avoid use of street drugs. Do not share needles with anyone. Ask for help if you need support or instructions about stopping the use of drugs.  High blood pressure causes heart disease and increases the risk of stroke. Your blood pressure should be checked at least every 1 to 2 years. Ongoing high blood pressure should be treated with medicines if weight loss and exercise do not work.  If you are 31-62 years old, ask your health care provider if you should take aspirin to prevent strokes.  Diabetes screening involves taking a blood sample to check your fasting blood sugar level. This should be done once every 3 years, after  age 52, if you are within normal weight and without risk factors for diabetes. Testing should be considered at a younger age or be carried out more frequently if you are overweight and have at least 1 risk factor for diabetes.  Breast cancer screening is essential preventive care for women. You should practice "breast self-awareness." This means understanding the normal appearance and feel of your breasts and may include breast self-examination. Any changes detected, no matter how small, should be reported to a health care provider. Women in their 27s and 30s should have a clinical breast exam (CBE) by a health care provider as part of a regular health exam every 1 to 3 years. After age 91, women should have a CBE every year. Starting at age 39, women should consider having a mammogram (breast X-ray test) every year. Women who have a family history of breast cancer should talk to their health care provider about genetic screening. Women at a high risk of breast cancer should talk to their health care providers about having an MRI and a mammogram  every year.  Breast cancer gene (BRCA)-related cancer risk assessment is recommended for women who have family members with BRCA-related cancers. BRCA-related cancers include breast, ovarian, tubal, and peritoneal cancers. Having family members with these cancers may be associated with an increased risk for harmful changes (mutations) in the breast cancer genes BRCA1 and BRCA2. Results of the assessment will determine the need for genetic counseling and BRCA1 and BRCA2 testing.  Routine pelvic exams to screen for cancer are no longer recommended for nonpregnant women who are considered low risk for cancer of the pelvic organs (ovaries, uterus, and vagina) and who do not have symptoms. Ask your health care provider if a screening pelvic exam is right for you.  If you have had past treatment for cervical cancer or a condition that could lead to cancer, you need Pap  tests and screening for cancer for at least 20 years after your treatment. If Pap tests have been discontinued, your risk factors (such as having a new sexual partner) need to be reassessed to determine if screening should be resumed. Some women have medical problems that increase the chance of getting cervical cancer. In these cases, your health care provider may recommend more frequent screening and Pap tests.  The HPV test is an additional test that may be used for cervical cancer screening. The HPV test looks for the virus that can cause the cell changes on the cervix. The cells collected during the Pap test can be tested for HPV. The HPV test could be used to screen women aged 9 years and older, and should be used in women of any age who have unclear Pap test results. After the age of 56, women should have HPV testing at the same frequency as a Pap test.  Colorectal cancer can be detected and often prevented. Most routine colorectal cancer screening begins at the age of 108 years and continues through age 34 years. However, your health care provider may recommend screening at an earlier age if you have risk factors for colon cancer. On a yearly basis, your health care provider may provide home test kits to check for hidden blood in the stool. Use of a small camera at the end of a tube, to directly examine the colon (sigmoidoscopy or colonoscopy), can detect the earliest forms of colorectal cancer. Talk to your health care provider about this at age 40, when routine screening begins. Direct exam of the colon should be repeated every 5-10 years through age 31 years, unless early forms of pre-cancerous polyps or small growths are found.  People who are at an increased risk for hepatitis B should be screened for this virus. You are considered at high risk for hepatitis B if:  You were born in a country where hepatitis B occurs often. Talk with your health care provider about which countries are considered  high risk.  Your parents were born in a high-risk country and you have not received a shot to protect against hepatitis B (hepatitis B vaccine).  You have HIV or AIDS.  You use needles to inject street drugs.  You live with, or have sex with, someone who has hepatitis B.  You get hemodialysis treatment.  You take certain medicines for conditions like cancer, organ transplantation, and autoimmune conditions.  Hepatitis C blood testing is recommended for all people born from [birth year] through 1967 and any individual with known risks for hepatitis C.  Practice safe sex. Use condoms and avoid high-risk sexual practices to reduce the spread  of sexually transmitted infections (STIs). STIs include gonorrhea, chlamydia, syphilis, trichomonas, herpes, HPV, and human immunodeficiency virus (HIV). Herpes, HIV, and HPV are viral illnesses that have no cure. They can result in disability, cancer, and death.  You should be screened for sexually transmitted illnesses (STIs) including gonorrhea and chlamydia if:  You are sexually active and are younger than 24 years.  You are older than 24 years and your health care provider tells you that you are at risk for this type of infection.  Your sexual activity has changed since you were last screened and you are at an increased risk for chlamydia or gonorrhea. Ask your health care provider if you are at risk.  If you are at risk of being infected with HIV, it is recommended that you take a prescription medicine daily to prevent HIV infection. This is called preexposure prophylaxis (PrEP). You are considered at risk if:  You are a heterosexual woman, are sexually active, and are at increased risk for HIV infection.  You take drugs by injection.  You are sexually active with a partner who has HIV.  Talk with your health care provider about whether you are at high risk of being infected with HIV. If you choose to begin PrEP, you should first be tested for HIV.  You should then be tested every 3 months for as long as you are taking PrEP.  Osteoporosis is a disease in which the bones lose minerals and strength with aging. This can result in serious bone fractures or breaks. The risk of osteoporosis can be identified using a bone density scan. Women ages 76 years and over and women at risk for fractures or osteoporosis should discuss screening with their health care providers. Ask your health care provider whether you should take a calcium supplement or vitamin D to reduce the rate of osteoporosis.  Menopause can be associated with physical symptoms and risks. Hormone replacement therapy is available to decrease symptoms and risks. You should talk to your health care provider about whether hormone replacement therapy is right for you.  Use sunscreen. Apply sunscreen liberally and repeatedly throughout the day. You should seek shade when your shadow is shorter than you. Protect yourself by wearing long sleeves, pants, a wide-brimmed hat, and sunglasses year round, whenever you are outdoors.  Once a month, do a whole body skin exam, using a mirror to look at the skin on your back. Tell your health care provider of new moles, moles that have irregular borders, moles that are larger than a pencil eraser, or moles that have changed in shape or color.  Stay current with required vaccines (immunizations).  Influenza vaccine. All adults should be immunized every year.  Tetanus, diphtheria, and acellular pertussis (Td, Tdap) vaccine. Pregnant women should receive 1 dose of Tdap vaccine during each pregnancy. The dose should be obtained regardless of the length of time since the last dose. Immunization is preferred during the 27th-36th week of gestation. An adult who has not previously received Tdap or who does not know her vaccine status should receive 1 dose of Tdap. This initial dose should be followed by tetanus and diphtheria toxoids (Td) booster doses every 10  years. Adults with an unknown or incomplete history of completing a 3-dose immunization series with Td-containing vaccines should begin or complete a primary immunization series including a Tdap dose. Adults should receive a Td booster every 10 years.  Varicella vaccine. An adult without evidence of immunity to varicella should receive 2 doses  or a second dose if she has previously received 1 dose. Pregnant females who do not have evidence of immunity should receive the first dose after pregnancy. This first dose should be obtained before leaving the health care facility. The second dose should be obtained 4-8 weeks after the first dose.  Human papillomavirus (HPV) vaccine. Females aged 13-26 years who have not received the vaccine previously should obtain the 3-dose series. The vaccine is not recommended for use in pregnant females. However, pregnancy testing is not needed before receiving a dose. If a female is found to be pregnant after receiving a dose, no treatment is needed. In that case, the remaining doses should be delayed until after the pregnancy. Immunization is recommended for any person with an immunocompromised condition through the age of 24 years if she did not get any or all doses earlier. During the 3-dose series, the second dose should be obtained 4-8 weeks after the first dose. The third dose should be obtained 24 weeks after the first dose and 16 weeks after the second dose.  Zoster vaccine. One dose is recommended for adults aged 59 years or older unless certain conditions are present.  Measles, mumps, and rubella (MMR) vaccine. Adults born before 60 generally are considered immune to measles and mumps. Adults born in [birth year] or later should have 1 or more doses of MMR vaccine unless there is a contraindication to the vaccine or there is laboratory evidence of immunity to each of the three diseases. A routine second dose of MMR vaccine should be obtained at least 28 days after the first  dose for students attending postsecondary schools, health care workers, or international travelers. People who received inactivated measles vaccine or an unknown type of measles vaccine during 1963-1967 should receive 2 doses of MMR vaccine. People who received inactivated mumps vaccine or an unknown type of mumps vaccine before 1979 and are at high risk for mumps infection should consider immunization with 2 doses of MMR vaccine. For females of childbearing age, rubella immunity should be determined. If there is no evidence of immunity, females who are not pregnant should be vaccinated. If there is no evidence of immunity, females who are pregnant should delay immunization until after pregnancy. Unvaccinated health care workers born before 25 who lack laboratory evidence of measles, mumps, or rubella immunity or laboratory confirmation of disease should consider measles and mumps immunization with 2 doses of MMR vaccine or rubella immunization with 1 dose of MMR vaccine.  Pneumococcal 13-valent conjugate (PCV13) vaccine. When indicated, a person who is uncertain of her immunization history and has no record of immunization should receive the PCV13 vaccine. An adult aged 74 years or older who has certain medical conditions and has not been previously immunized should receive 1 dose of PCV13 vaccine. This PCV13 should be followed with a dose of pneumococcal polysaccharide (PPSV23) vaccine. The PPSV23 vaccine dose should be obtained at least 8 weeks after the dose of PCV13 vaccine. An adult aged 14 years or older who has certain medical conditions and previously received 1 or more doses of PPSV23 vaccine should receive 1 dose of PCV13. The PCV13 vaccine dose should be obtained 1 or more years after the last PPSV23 vaccine dose.  Pneumococcal polysaccharide (PPSV23) vaccine. When PCV13 is also indicated, PCV13 should be obtained first. All adults aged 29 years and older should be immunized. An adult younger than  age 16 years who has certain medical conditions should be immunized. Any person who resides in a  nursing home or long-term care facility should be immunized. An adult smoker should be immunized. People with an immunocompromised condition and certain other conditions should receive both PCV13 and PPSV23 vaccines. People with human immunodeficiency virus (HIV) infection should be immunized as soon as possible after diagnosis. Immunization during chemotherapy or radiation therapy should be avoided. Routine use of PPSV23 vaccine is not recommended for American Indians, [place] Natives, or people younger than 65 years unless there are medical conditions that require PPSV23 vaccine. When indicated, people who have unknown immunization and have no record of immunization should receive PPSV23 vaccine. One-time revaccination 5 years after the first dose of PPSV23 is recommended for people aged 19-64 years who have chronic kidney failure, nephrotic syndrome, asplenia, or immunocompromised conditions. People who received 1-2 doses of PPSV23 before age 66 years should receive another dose of PPSV23 vaccine at age 8 years or later if at least 5 years have passed since the previous dose. Doses of PPSV23 are not needed for people immunized with PPSV23 at or after age 53 years.  Meningococcal vaccine. Adults with asplenia or persistent complement component deficiencies should receive 2 doses of quadrivalent meningococcal conjugate (MenACWY-D) vaccine. The doses should be obtained at least 2 months apart. Microbiologists working with certain meningococcal bacteria, [employer] recruits, people at risk during an outbreak, and people who travel to or live in countries with a high rate of meningitis should be immunized. A first-year college student up through age 19 years who is living in a residence hall should receive a dose if she did not receive a dose on or after her 16th birthday. Adults who have certain high-risk conditions  should receive one or more doses of vaccine.  Hepatitis A vaccine. Adults who wish to be protected from this disease, have certain high-risk conditions, work with hepatitis A-infected animals, work in hepatitis A research labs, or travel to or work in countries with a high rate of hepatitis A should be immunized. Adults who were previously unvaccinated and who anticipate close contact with an international adoptee during the first 60 days after arrival in the [place] from a country with a high rate of hepatitis A should be immunized.  Hepatitis B vaccine. Adults who wish to be protected from this disease, have certain high-risk conditions, may be exposed to blood or other infectious body fluids, are household contacts or sex partners of hepatitis B positive people, are clients or workers in certain care facilities, or travel to or work in countries with a high rate of hepatitis B should be immunized.  Haemophilus influenzae type b (Hib) vaccine. A previously unvaccinated person with asplenia or sickle cell disease or having a scheduled splenectomy should receive 1 dose of Hib vaccine. Regardless of previous immunization, a recipient of a hematopoietic stem cell transplant should receive a 3-dose series 6-12 months after her successful transplant. Hib vaccine is not recommended for adults with HIV infection. Preventive Services / Frequency Ages 88 to 58 years  Blood pressure check.** / Every 1 to 2 years.  Lipid and cholesterol check.** / Every 5 years beginning at age 39.  Clinical breast exam.** / Every 3 years for women in their 26s and 4s.  BRCA-related cancer risk assessment.** / For women who have family members with a BRCA-related cancer (breast, ovarian, tubal, or peritoneal cancers).  Pap test.** / Every 2 years from ages 102 through 57. Every 3 years starting at age 21 through age 72 or 71 with a history of 3 consecutive normal  Pap tests.  HPV screening.** / Every 3 years from ages  31 through ages 47 to 53 with a history of 3 consecutive normal Pap tests.  Hepatitis C blood test.** / For any individual with known risks for hepatitis C.  Skin self-exam. / Monthly.  Influenza vaccine. / Every year.  Tetanus, diphtheria, and acellular pertussis (Tdap, Td) vaccine.** / Consult your health care provider. Pregnant women should receive 1 dose of Tdap vaccine during each pregnancy. 1 dose of Td every 10 years.  Varicella vaccine.** / Consult your health care provider. Pregnant females who do not have evidence of immunity should receive the first dose after pregnancy.  HPV vaccine. / 3 doses over 6 months, if 78 and younger. The vaccine is not recommended for use in pregnant females. However, pregnancy testing is not needed before receiving a dose.  Measles, mumps, rubella (MMR) vaccine.** / You need at least 1 dose of MMR if you were born in 1957 or later. You may also need a 2nd dose. For females of childbearing age, rubella immunity should be determined. If there is no evidence of immunity, females who are not pregnant should be vaccinated. If there is no evidence of immunity, females who are pregnant should delay immunization until after pregnancy.  Pneumococcal 13-valent conjugate (PCV13) vaccine.** / Consult your health care provider.  Pneumococcal polysaccharide (PPSV23) vaccine.** / 1 to 2 doses if you smoke cigarettes or if you have certain conditions.  Meningococcal vaccine.** / 1 dose if you are age 25 to 34 years and a [employer] researcher living in a residence hall, or have one of several medical conditions, you need to get vaccinated against meningococcal disease. You may also need additional booster doses.  Hepatitis A vaccine.** / Consult your health care provider.  Hepatitis B vaccine.** / Consult your health care provider.  Haemophilus influenzae type b (Hib) vaccine.** / Consult your health care provider. Ages 32 to 54 years  Blood pressure check.**  / Every 1 to 2 years.  Lipid and cholesterol check.** / Every 5 years beginning at age 62 years.  Lung cancer screening. / Every year if you are aged 71-80 years and have a 30-pack-year history of smoking and currently smoke or have quit within the past 15 years. Yearly screening is stopped once you have quit smoking for at least 15 years or develop a health problem that would prevent you from having lung cancer treatment.  Clinical breast exam.** / Every year after age 80 years.  BRCA-related cancer risk assessment.** / For women who have family members with a BRCA-related cancer (breast, ovarian, tubal, or peritoneal cancers).  Mammogram.** / Every year beginning at age 65 years and continuing for as long as you are in good health. Consult with your health care provider.  Pap test.** / Every 3 years starting at age 56 years through age 69 or 59 years with a history of 3 consecutive normal Pap tests.  HPV screening.** / Every 3 years from ages 56 years through ages 76 to 59 years with a history of 3 consecutive normal Pap tests.  Fecal occult blood test (FOBT) of stool. / Every year beginning at age 47 years and continuing until age 34 years. You may not need to do this test if you get a colonoscopy every 10 years.  Flexible sigmoidoscopy or colonoscopy.** / Every 5 years for a flexible sigmoidoscopy or every 10 years for a colonoscopy beginning at age 70 years and continuing until age 39 years.  Hepatitis C blood test.** / For all people born from [birth year] through 1967 and any individual with known risks for hepatitis C.  Skin self-exam. / Monthly.  Influenza vaccine. / Every year.  Tetanus, diphtheria, and acellular pertussis (Tdap/Td) vaccine.** / Consult your health care provider. Pregnant women should receive 1 dose of Tdap vaccine during each pregnancy. 1 dose of Td every 10 years.  Varicella vaccine.** / Consult your health care provider. Pregnant females who do not have evidence of  immunity should receive the first dose after pregnancy.  Zoster vaccine.** / 1 dose for adults aged 22 years or older.  Measles, mumps, rubella (MMR) vaccine.** / You need at least 1 dose of MMR if you were born in 1957 or later. You may also need a 2nd dose. For females of childbearing age, rubella immunity should be determined. If there is no evidence of immunity, females who are not pregnant should be vaccinated. If there is no evidence of immunity, females who are pregnant should delay immunization until after pregnancy.  Pneumococcal 13-valent conjugate (PCV13) vaccine.** / Consult your health care provider.  Pneumococcal polysaccharide (PPSV23) vaccine.** / 1 to 2 doses if you smoke cigarettes or if you have certain conditions.  Meningococcal vaccine.** / Consult your health care provider.  Hepatitis A vaccine.** / Consult your health care provider.  Hepatitis B vaccine.** / Consult your health care provider.  Haemophilus influenzae type b (Hib) vaccine.** / Consult your health care provider. Ages 46 years and over  Blood pressure check.** / Every 1 to 2 years.  Lipid and cholesterol check.** / Every 5 years beginning at age 28 years.  Lung cancer screening. / Every year if you are aged 77-80 years and have a 30-pack-year history of smoking and currently smoke or have quit within the past 15 years. Yearly screening is stopped once you have quit smoking for at least 15 years or develop a health problem that would prevent you from having lung cancer treatment.  Clinical breast exam.** / Every year after age 44 years.  BRCA-related cancer risk assessment.** / For women who have family members with a BRCA-related cancer (breast, ovarian, tubal, or peritoneal cancers).  Mammogram.** / Every year beginning at age 73 years and continuing for as long as you are in good health. Consult with your health care provider.  Pap test.** / Every 3 years starting at age 40 years through age 33 or  23 years with 3 consecutive normal Pap tests. Testing can be stopped between 65 and 70 years with 3 consecutive normal Pap tests and no abnormal Pap or HPV tests in the past 10 years.  HPV screening.** / Every 3 years from ages 41 years through ages 74 or 45 years with a history of 3 consecutive normal Pap tests. Testing can be stopped between 65 and 70 years with 3 consecutive normal Pap tests and no abnormal Pap or HPV tests in the past 10 years.  Fecal occult blood test (FOBT) of stool. / Every year beginning at age 54 years and continuing until age 71 years. You may not need to do this test if you get a colonoscopy every 10 years.  Flexible sigmoidoscopy or colonoscopy.** / Every 5 years for a flexible sigmoidoscopy or every 10 years for a colonoscopy beginning at age 56 years and continuing until age 84 years.  Hepatitis C blood test.** / For all people born from [birth year] through 1965 and any individual with known risks for hepatitis C.  Osteoporosis screening.** /  A one-time screening for women ages 16 years and over and women at risk for fractures or osteoporosis.  Skin self-exam. / Monthly.  Influenza vaccine. / Every year.  Tetanus, diphtheria, and acellular pertussis (Tdap/Td) vaccine.** / 1 dose of Td every 10 years.  Varicella vaccine.** / Consult your health care provider.  Zoster vaccine.** / 1 dose for adults aged 51 years or older.  Pneumococcal 13-valent conjugate (PCV13) vaccine.** / Consult your health care provider.  Pneumococcal polysaccharide (PPSV23) vaccine.** / 1 dose for all adults aged 39 years and older.  Meningococcal vaccine.** / Consult your health care provider.  Hepatitis A vaccine.** / Consult your health care provider.  Hepatitis B vaccine.** / Consult your health care provider.  Haemophilus influenzae type b (Hib) vaccine.** / Consult your health care provider. ** Family history and personal history of risk and conditions may change your health care  provider's recommendations. Document Released: 01/07/2002 Document Revised: 03/28/2014 Document Reviewed: 04/08/2011 [facility] Patient Information 2015 [place], [place]. This information is not intended to replace advice given to you by your health care provider. Make sure you discuss any questions you have with your health care provider.   [email or web address]

## 2015-05-23 NOTE — Progress Notes (Signed)
GYNECOLOGY CLINIC ANNUAL PREVENTATIVE CARE ENCOUNTER NOTE  Subjective:   Sierra Coleman is a 44 y.o. G25P2002 female here for a routine annual gynecologic exam.  Current complaints: occasional hot flashes and vaginal dryness.  Hot flashes are not debilitating; does not want intervention for this.  Treats vaginal dryness with OTC lubricants.  Of note, patient had labs done at outside facility which showed [place] of 149.70mIU/ml consistent with menopausal state; normal CBC,CMET, thyroid function labs.  Denies abnormal vaginal bleeding (amenorrheic since ablation), discharge or other gynecologic concerns.    Gynecologic History No LMP recorded. Patient has had an ablation. Contraception: vasectomy Last Pap: 03/17/12. Results were: normal with negative HRHPV Last mammogram: 06/24/14. Results were: normal  Obstetric History OB History  Gravida Para Term Preterm AB SAB TAB Ectopic Multiple Living  2 2 2       2     # Outcome Date GA Lbr Len/2nd Weight Sex Delivery Anes PTL Lv  2 Term           1 Term               Past Medical History  Diagnosis Date  . Abnormal uterine bleeding   . Hyperlipidemia   . Fibroids   . Other and unspecified ovarian cysts   . Endometrial polyp     Past Surgical History  Procedure Laterality Date  . Dilation and curettage of uterus    . Uterine ablation      Current Outpatient Prescriptions on File Prior to Visit  Medication Sig Dispense Refill  . ferrous sulfate 325 (65 FE) MG tablet Take 325 mg by mouth daily with breakfast.     No current facility-administered medications on file prior to visit.    No Known Allergies  History   Social History  . Marital Status: Married    Spouse Name: N/A  . Number of Children: N/A  . Years of Education: N/A   Occupational History  . preschool teacher    Social History Main Topics  . Smoking status: Never Smoker   . Smokeless tobacco: Never Used  . Alcohol Use: No  . Drug Use: No  . Sexual Activity:     Partners: Male    Birth Control/ Protection: Surgical   Other Topics Concern  . Not on file   Social History Narrative    Family History  Problem Relation Age of Onset  . Heart disease Paternal Grandfather     The following portions of the patient's history were reviewed and updated as appropriate: allergies, current medications, past family history, past medical history, past social history, past surgical history and problem list.  Review of Systems Pertinent items are noted in HPI.   Objective:  BP 104/69 mmHg  Pulse 69  Resp 16  Ht 5\' 6"  (1.676 m)  Wt 131 lb (59.421 kg)  BMI 21.15 kg/m2 CONSTITUTIONAL: Well-developed, well-nourished female in no acute distress.  HENT:  Normocephalic, atraumatic, External right and left ear normal. Oropharynx is clear and moist EYES: Conjunctivae and EOM are normal. Pupils are equal, round, and reactive to light. No scleral icterus.  NECK: Normal range of motion, supple, no masses.  Normal thyroid.  SKIN: Skin is warm and dry. No rash noted. Not diaphoretic. No erythema. No pallor. [place]: Alert and oriented to person, place, and time. Normal reflexes, muscle tone coordination. No cranial nerve deficit noted. PSYCHIATRIC: Normal mood and affect. Normal behavior. Normal judgment and thought content. CARDIOVASCULAR: Normal heart rate noted,  regular rhythm RESPIRATORY: Clear to auscultation bilaterally. Effort and breath sounds normal, no problems with respiration noted. BREASTS: Symmetric in size. No masses, skin changes, nipple drainage, or lymphadenopathy. ABDOMEN: Soft, normal bowel sounds, no distention noted.  No tenderness, rebound or guarding.  PELVIC: Normal appearing external genitalia; normal appearing vaginal mucosa and cervix.  No abnormal discharge noted.  Pap smear obtained.  Normal uterine size, no other palpable masses, no uterine or adnexal tenderness. MUSCULOSKELETAL: Normal range of motion. No tenderness.  No cyanosis,  clubbing, or edema.  2+ distal pulses.   Assessment:  Annual gynecologic examination with pap smear Vasomotor menopaussal symptoms   Plan:  Will follow up results of pap smear and manage accordingly. Mammogram scheduled Counseled about lifestyle modifications and other interventions that can help with vasomotor symptoms; referred to [email or web address] for other ideas.  Advised her to let us know if further pharmacologic intervention was desired.  Routine preventative health maintenance measures emphasized. Please refer to After Visit Summary for other counseling recommendations.    Verita Schneiders, MD, [place] Attending [place] for [employer], [place]

## 2015-05-24 ENCOUNTER — Encounter: Payer: Self-pay | Admitting: *Deleted

## 2015-05-25 LAB — CYTOLOGY - PAP

## 2015-07-19 ENCOUNTER — Ambulatory Visit (INDEPENDENT_AMBULATORY_CARE_PROVIDER_SITE_OTHER): Payer: 59

## 2015-07-19 DIAGNOSIS — Z1231 Encounter for screening mammogram for malignant neoplasm of breast: Secondary | ICD-10-CM

## 2015-11-28 ENCOUNTER — Encounter: Payer: Self-pay | Admitting: Physician Assistant

## 2015-11-28 ENCOUNTER — Ambulatory Visit (INDEPENDENT_AMBULATORY_CARE_PROVIDER_SITE_OTHER): Payer: 59 | Admitting: Physician Assistant

## 2015-11-28 VITALS — BP 106/67 | HR 67 | Resp 16 | Ht 67.0 in | Wt 140.0 lb

## 2015-11-28 DIAGNOSIS — L309 Dermatitis, unspecified: Secondary | ICD-10-CM

## 2015-11-28 DIAGNOSIS — N644 Mastodynia: Secondary | ICD-10-CM

## 2015-11-28 MED ORDER — TRIAMCINOLONE ACETONIDE 0.025 % EX OINT: 1.0000 "application " | TOPICAL_OINTMENT | Freq: Two times a day (BID) | CUTANEOUS | Status: AC

## 2015-11-28 NOTE — Patient Instructions (Signed)
Menopause Menopause is the normal time of life when menstrual periods stop completely. Menopause is complete when you have missed 12 consecutive menstrual periods. It usually occurs between the ages of 48 years and 55 years. Very rarely does a woman develop menopause before the age of 40 years. At menopause, your ovaries stop producing the female hormones estrogen and progesterone. This can cause undesirable symptoms and also affect your health. Sometimes the symptoms may occur 4-5 years before the menopause begins. There is no relationship between menopause and:  Oral contraceptives.  Number of children you had.  Race.  The age your menstrual periods started (menarche). Heavy smokers and very thin women may develop menopause earlier in life. CAUSES  The ovaries stop producing the female hormones estrogen and progesterone.  Other causes include:  Surgery to remove both ovaries.  The ovaries stop functioning for no known reason.  Tumors of the pituitary gland in the brain.  Medical disease that affects the ovaries and hormone production.  Radiation treatment to the abdomen or pelvis.  Chemotherapy that affects the ovaries. SYMPTOMS   Hot flashes.  Night sweats.  Decrease in sex drive.  Vaginal dryness and thinning of the vagina causing painful intercourse.  Dryness of the skin and developing wrinkles.  Headaches.  Tiredness.  Irritability.  Memory problems.  Weight gain.  Bladder infections.  Hair growth of the face and chest.  Infertility. More serious symptoms include:  Loss of bone (osteoporosis) causing breaks (fractures).  Depression.  Hardening and narrowing of the arteries (atherosclerosis) causing heart attacks and strokes. DIAGNOSIS   When the menstrual periods have stopped for 12 straight months.  Physical exam.  Hormone studies of the blood. TREATMENT  There are many treatment choices and nearly as many questions about them. The  decisions to treat or not to treat menopausal changes is an individual choice made with your health care provider. Your health care provider can discuss the treatments with you. Together, you can decide which treatment will work best for you. Your treatment choices may include:   Hormone therapy (estrogen and progesterone).  Non-hormonal medicines.  Treating the individual symptoms with medicine (for example antidepressants for depression).  Herbal medicines that may help specific symptoms.  Counseling by a psychiatrist or psychologist.  Group therapy.  Lifestyle changes including:  Eating healthy.  Regular exercise.  Limiting caffeine and alcohol.  Stress management and meditation.  No treatment. HOME CARE INSTRUCTIONS   Take the medicine your health care provider gives you as directed.  Get plenty of sleep and rest.  Exercise regularly.  Eat a diet that contains calcium (good for the bones) and soy products (acts like estrogen hormone).  Avoid alcoholic beverages.  Do not smoke.  If you have hot flashes, dress in layers.  Take supplements, calcium, and vitamin D to strengthen bones.  You can use over-the-counter lubricants or moisturizers for vaginal dryness.  Group therapy is sometimes very helpful.  Acupuncture may be helpful in some cases. SEEK MEDICAL CARE IF:   You are not sure you are in menopause.  You are having menopausal symptoms and need advice and treatment.  You are still having menstrual periods after age 55 years.  You have pain with intercourse.  Menopause is complete (no menstrual period for 12 months) and you develop vaginal bleeding.  You need a referral to a specialist (gynecologist, psychiatrist, or psychologist) for treatment. SEEK IMMEDIATE MEDICAL CARE IF:   You have severe depression.  You have excessive vaginal bleeding.    You fell and think you have a broken bone.  You have pain when you urinate.  You develop leg or  chest pain.  You have a fast pounding heart beat (palpitations).  You have severe headaches.  You develop vision problems.  You feel a lump in your breast.  You have abdominal pain or severe indigestion.   This information is not intended to replace advice given to you by your health care provider. Make sure you discuss any questions you have with your health care provider.   Document Released: 02/01/2004 Document Revised: 07/14/2013 Document Reviewed: 06/10/2013 Elsevier Interactive Patient Education 2016 [place] Practicing breast self-awareness may pick up problems early, prevent significant medical complications, and possibly save your life. By practicing breast self-awareness, you can become familiar with how your breasts look and feel and if your breasts are changing. This allows you to notice changes early. It can also offer you some reassurance that your breast health is good. One way to learn what is normal for your breasts and whether your breasts are changing is to do a breast self-exam. If you find a lump or something that was not present in the past, it is best to contact your caregiver right away. Other findings that should be evaluated by your caregiver include nipple discharge, especially if it is bloody; skin changes or reddening; areas where the skin seems to be pulled in (retracted); or new lumps and bumps. Breast pain is seldom associated with cancer (malignancy), but should also be evaluated by a caregiver. HOW TO PERFORM A BREAST SELF-EXAM The best time to examine your breasts is 5-7 days after your menstrual period is over. During menstruation, the breasts are lumpier, and it may be more difficult to pick up changes. If you do not menstruate, have reached menopause, or had your uterus removed (hysterectomy), you should examine your breasts at regular intervals, such as monthly. If you are breastfeeding, examine your breasts after a feeding or  after using a breast pump. Breast implants do not decrease the risk for lumps or tumors, so continue to perform breast self-exams as recommended. Talk to your caregiver about how to determine the difference between the implant and breast tissue. Also, talk about the amount of pressure you should use during the exam. Over time, you will become more familiar with the variations of your breasts and more comfortable with the exam. A breast self-exam requires you to remove all your clothes above the waist.  Look at your breasts and nipples. Stand in front of a mirror in a room with good lighting. With your hands on your hips, push your hands firmly downward. Look for a difference in shape, contour, and size from one breast to the other (asymmetry). Asymmetry includes puckers, dips, or bumps. Also, look for skin changes, such as reddened or scaly areas on the breasts. Look for nipple changes, such as discharge, dimpling, repositioning, or redness.  Carefully feel your breasts. This is best done either in the shower or tub while using soapy water or when flat on your back. Place the arm (on the side of the breast you are examining) above your head. Use the pads (not the fingertips) of your three middle fingers on your opposite hand to feel your breasts. Start in the underarm area and use  inch (2 cm) overlapping circles to feel your breast. Use 3 different levels of pressure (light, medium, and firm pressure) at each circle before moving to the next circle. The light  pressure is needed to feel the tissue closest to the skin. The medium pressure will help to feel breast tissue a little deeper, while the firm pressure is needed to feel the tissue close to the ribs. Continue the overlapping circles, moving downward over the breast until you feel your ribs below your breast. Then, move one finger-width towards the center of the body. Continue to use the  inch (2 cm) overlapping circles to feel your breast as you move  slowly up toward the collar bone (clavicle) near the base of the neck. Continue the up and down exam using all 3 pressures until you reach the middle of the chest. Do this with each breast, carefully feeling for lumps or changes.   Keep a written record with breast changes or normal findings for each breast. By writing this information down, you do not need to depend only on memory for size, tenderness, or location. Write down where you are in your menstrual cycle, if you are still menstruating. Breast tissue can have some lumps or thick tissue. However, see your caregiver if you find anything that concerns you.  SEEK MEDICAL CARE IF:  You see a change in shape, contour, or size of your breasts or nipples.   You see skin changes, such as reddened or scaly areas on the breasts or nipples.   You have an unusual discharge from your nipples.   You feel a new lump or unusually thick areas.    This information is not intended to replace advice given to you by your health care provider. Make sure you discuss any questions you have with your health care provider.   Document Released: 11/11/2005 Document Revised: 10/28/2012 Document Reviewed: 02/26/2012 Elsevier Interactive Patient Education [employer].

## 2015-11-28 NOTE — Progress Notes (Deleted)
Patient ID: TEGRA GROEN, female   DOB: 1970/12/29, 45 y.o.   MRN: [id number] History:  @PATIENTNAME @ is a 45 y.o. G2P2002 who presents to clinic today for ***   The following portions of the patient's history were reviewed and updated as appropriate: allergies, current medications, past family history, past medical history, past social history, past surgical history and problem list.  Review of Systems:  {Ros - complete:30496}  Objective:  Physical Exam There were no vitals taken for this visit. GENERAL: Well-developed, well-nourished female in no acute distress.  HEENT: Normocephalic, atraumatic.  NECK: Supple. Normal thyroid.  RESPIRATORY: Normal rate. Clear to auscultation bilaterally.  CARDIOVASCULAR: Regular rate and rhythm with no adventitious sounds.  BREASTS: Symmetric in size. No masses, skin changes, nipple drainage, or lymphadenopathy. ABDOMEN: Soft, nontender, nondistended. No organomegaly. Normal bowel sounds appreciated in all quadrants.  PELVIC: Normal external female genitalia. Vagina is pink and rugated.  Normal discharge. Normal cervix contour. Pap smear obtained. Uterus is normal in size. No adnexal mass or tenderness.  EXTREMITIES: No cyanosis, clubbing, or edema, 2+ distal pulses.   Labs and Imaging No results found.  Assessment & Plan:  Assessment: ***  Plans: ***  Paticia Stack, PA-C 11/28/2015 1:36 PM

## 2015-11-28 NOTE — Progress Notes (Signed)
Patient ID: Sierra Coleman, female   DOB: [birth year], 45 y.o.   MRN: [id number] History:  WILLEAN HORNBURG is a 45 y.o. [id number] who presents to clinic today for breast evaluation.  The pain has been coming and going every 1-3 months.  This most recent time, it started 2 weeks ago.  It usually lasts a few days but it has been ongoing for two weeks.  It has been improving slowly over the last week and is almost completely resolved now.  At the peak, it was described as a full feeling with significant tenderness.  She would be uncomfortable with brushing up against something.  She would guard herself when her dog would come toward her.     She also has a small dry, rash on left breast.  Present x 2 weeks.  Has had previously but not associated  The following portions of the patient's history were reviewed and updated as appropriate: allergies, current medications, past family history, past medical history, past social history, past surgical history and problem list.  Review of Systems:  Pertinent ROS in HPI.  All other systems are negative.    Objective:  Physical Exam BP 106/67 mmHg  Pulse 67  Resp 16  Ht 5\' 7"  (1.702 m)  Wt 140 lb (63.504 kg)  BMI 21.92 kg/m2 GENERAL: Well-developed, well-nourished female in no acute distress.  HEENT: Normocephalic, atraumatic.  RESPIRATORY: Normal rate. No respiratory distress.  Normal effort.   CARDIOVASCULAR: Regular rate  BREASTS: Symmetric in size. No masses, left breast with one small patch of hyperpigmentation in Lower quadrant.   ABDOMEN: Soft, nontender, nondistended. No organomegaly. Normal bowel sounds appreciated in all quadrants.  PELVIC: Normal external female genitalia. Vagina is pink and rugated.  Normal discharge. Normal cervix contour. Pap smear obtained. Uterus is normal in size. No adnexal mass or tenderness.  EXTREMITIES: No cyanosis, clubbing, or edema, 2+ distal pulses.   Labs and Imaging No results found.  Assessment & Plan:   Assessment: Breast pain - self resolving Eczema?  Plans: RTC if no improvement Annual mammograms Triamcinolone to affected area See derm if no improvement in breast rash.  Paticia Stack, PA-C 11/28/2015 1:40 PM

## 2016-08-06 ENCOUNTER — Other Ambulatory Visit: Payer: Self-pay | Admitting: Family Medicine

## 2016-08-06 DIAGNOSIS — Z1231 Encounter for screening mammogram for malignant neoplasm of breast: Secondary | ICD-10-CM

## 2016-08-13 ENCOUNTER — Ambulatory Visit (INDEPENDENT_AMBULATORY_CARE_PROVIDER_SITE_OTHER): Payer: 59

## 2016-08-13 DIAGNOSIS — Z1231 Encounter for screening mammogram for malignant neoplasm of breast: Secondary | ICD-10-CM
